# Patient Record
Sex: Male | Born: 1985 | Race: Black or African American | Hispanic: No | Marital: Single | Smoking: Current every day smoker
Health system: Southern US, Community
[De-identification: ages and names within clinical notes are randomized; demographics above are authoritative.]

---

## 2011-04-30 ENCOUNTER — Emergency Department: Payer: Self-pay | Admitting: Emergency Medicine

## 2016-10-03 ENCOUNTER — Encounter: Payer: Self-pay | Admitting: Emergency Medicine

## 2016-10-03 ENCOUNTER — Emergency Department: Payer: Commercial Managed Care - PPO

## 2016-10-03 ENCOUNTER — Emergency Department
Admission: EM | Admit: 2016-10-03 | Discharge: 2016-10-03 | Disposition: A | Discharge status: Home / Self Care | Payer: Commercial Managed Care - PPO | Attending: Emergency Medicine | Admitting: Emergency Medicine

## 2016-10-03 DIAGNOSIS — K219 Gastro-esophageal reflux disease without esophagitis: Secondary | ICD-10-CM | POA: Diagnosis not present

## 2016-10-03 DIAGNOSIS — R0789 Other chest pain: Secondary | ICD-10-CM | POA: Diagnosis present

## 2016-10-03 DIAGNOSIS — F17210 Nicotine dependence, cigarettes, uncomplicated: Secondary | ICD-10-CM | POA: Insufficient documentation

## 2016-10-03 LAB — CBC
HEMATOCRIT: 50.3 % (ref 40.0–52.0)
Hemoglobin: 17.3 g/dL (ref 13.0–18.0)
MCH: 31.9 pg (ref 26.0–34.0)
MCHC: 34.5 g/dL (ref 32.0–36.0)
MCV: 92.5 fL (ref 80.0–100.0)
PLATELETS: 283 10*3/uL (ref 150–440)
RBC: 5.43 MIL/uL (ref 4.40–5.90)
RDW: 12.4 % (ref 11.5–14.5)
WBC: 14.9 10*3/uL — AB (ref 3.8–10.6)

## 2016-10-03 LAB — BASIC METABOLIC PANEL
Anion gap: 10 (ref 5–15)
BUN: 10 mg/dL (ref 6–20)
CHLORIDE: 102 mmol/L (ref 101–111)
CO2: 25 mmol/L (ref 22–32)
CREATININE: 1.14 mg/dL (ref 0.61–1.24)
Calcium: 9.3 mg/dL (ref 8.9–10.3)
GFR calc Af Amer: >60 mL/min (ref >60)
GFR calc non Af Amer: >60 mL/min (ref >60)
GLUCOSE: 106 mg/dL — AB (ref 65–99)
Potassium: 3.3 mmol/L — ABNORMAL LOW (ref 3.5–5.1)
SODIUM: 137 mmol/L (ref 135–145)

## 2016-10-03 LAB — TROPONIN I: Troponin I: <0.03 ng/mL (ref <0.03)

## 2016-10-03 MED ORDER — SUCRALFATE 1 G PO TABS: 1.0000 g | ORAL_TABLET | Freq: Four times a day (QID) | ORAL | 1 refills | Status: AC

## 2016-10-03 MED ORDER — FAMOTIDINE 20 MG PO TABS
40.0000 mg | ORAL_TABLET | Freq: Once | ORAL | Status: AC
  Administered 2016-10-03: 40 mg via ORAL
  Filled 2016-10-03: qty 2

## 2016-10-03 MED ORDER — GI COCKTAIL ~~LOC~~
30.0000 mL | ORAL | Status: AC
  Administered 2016-10-03: 30 mL via ORAL
  Filled 2016-10-03: qty 30

## 2016-10-03 MED ORDER — FAMOTIDINE 20 MG PO TABS: 20.0000 mg | ORAL_TABLET | Freq: Two times a day (BID) | ORAL | 0 refills | Status: AC

## 2016-10-03 NOTE — ED Triage Notes (Signed)
Patient presents to the ED with central chest pain radiating into his left side and left arm that began approx. 30 minutes ago.  Patient reports shortness of breath and weakness, denies nausea and diaphoresis.  Patient is alert and oriented x 4.  No obvious distress at this time.

## 2016-10-03 NOTE — ED Provider Notes (Signed)
[facility] Emergency Department Provider Note  ____________________________________________  Time seen: Approximately 12:08 PM  I have reviewed the triage vital signs and the nursing notes.   HISTORY  Chief Complaint Chest Pain    HPI George Koch is a 31 y.o. male who complains of central chest pain described as tightness and burning that started around 3 AM today. Intermittent episodes lasting 10 minutes at a time. Feels worse lying supine or on his stomach, better sitting up. Not exertional, not pleuritic. No shortness of breath diaphoresis or vomiting. Does feel some tightness in his left upper arm as well but not frankly radiating..Never had anything like this before. He does smoke every day, but denies any other medical history. No recent travel, hospitalization or surgery. Pain started after he got off from work at 2 AM today. He last ate at 5 PM yesterday. Hasn't noticed if eating or drinking makes it better or worse.     History reviewed. No pertinent past medical history.   There are no active problems to display for this patient.    History reviewed. No pertinent surgical history.   Prior to Admission medications   Medication Sig Start Date End Date Taking? Authorizing Provider  famotidine (PEPCID) 20 MG tablet Take 1 tablet (20 mg total) by mouth 2 (two) times daily. 10/03/16   Sharman CheekPhillip Daimien Patmon, MD  sucralfate (CARAFATE) 1 g tablet Take 1 tablet (1 g total) by mouth 4 (four) times daily. 10/03/16   Sharman CheekPhillip Castulo Scarpelli, MD     Allergies Penicillins   No family history on file.  Social History Social History  Substance Use Topics  . Smoking status: Current Every Day Smoker    Packs/day: 0.75    Types: Cigarettes  . Smokeless tobacco: Never Used  . Alcohol use Yes     Comment: "every once in a while"    Review of Systems  Constitutional:   No fever or chills.  ENT:   No sore throat. No rhinorrhea. Cardiovascular:   Positive as above  chest pain. Respiratory:   No dyspnea or cough. Gastrointestinal:   Negative for abdominal pain, vomiting and diarrhea.  Genitourinary:   Negative for dysuria or difficulty urinating. Musculoskeletal:   Negative for focal pain or swelling Neurological:   Negative for headaches 10-point ROS otherwise negative.  ____________________________________________   PHYSICAL EXAM:  VITAL SIGNS: ED Triage Vitals [10/03/16 0721]  Enc Vitals Group     BP (!) 153/100     Pulse Rate 95     Resp 18     Temp 98.9 F (37.2 C)     Temp Source Oral     SpO2 100 %     Weight (!) 313 lb (142 kg)     Height 6\' 3"  (1.905 m)     Head Circumference      Peak Flow      Pain Score 8     Pain Loc      Pain Edu?      Excl. in GC?     Vital signs reviewed, nursing assessments reviewed.   Constitutional:   Alert and oriented. Well appearing and in no distress. Eyes:   No scleral icterus. No conjunctival pallor. PERRL. EOMI.  No nystagmus. ENT   Head:   Normocephalic and atraumatic.   Nose:   No congestion/rhinnorhea. No septal hematoma   Mouth/Throat:   MMM, no pharyngeal erythema. No peritonsillar mass.    Neck:   No stridor. No SubQ emphysema. No  meningismus. Hematological/Lymphatic/Immunilogical:   No cervical lymphadenopathy. Cardiovascular:   RRR. Symmetric bilateral radial and DP pulses.  No murmurs.  Respiratory:   Normal respiratory effort without tachypnea nor retractions. Breath sounds are clear and equal bilaterally. No wheezes/rales/rhonchi. Chest wall nontender Gastrointestinal:   Soft and nontender. Non distended. There is no CVA tenderness.  No rebound, rigidity, or guarding. Genitourinary:   deferred Musculoskeletal:   Nontender with normal range of motion in all extremities. No joint effusions.  No lower extremity tenderness.  No edema. Neurologic:   Normal speech and language.  CN 2-10 normal. Motor grossly intact. No gross focal neurologic deficits are appreciated.   Skin:    Skin is warm, dry and intact. No rash noted.  No petechiae, purpura, or bullae.  ____________________________________________    LABS (pertinent positives/negatives) (all labs ordered are listed, but only abnormal results are displayed) Labs Reviewed  BASIC METABOLIC PANEL - Abnormal; Notable for the following:       Result Value   Potassium 3.3 (*)    Glucose, Bld 106 (*)    All other components within normal limits  CBC - Abnormal; Notable for the following:    WBC 14.9 (*)    All other components within normal limits  TROPONIN I   ____________________________________________   EKG  Interpreted by me  Date: 10/03/2016  Rate: 100  Rhythm: normal sinus rhythm  QRS Axis: normal  Intervals: normal  ST/T Wave abnormalities: normal  Conduction Disutrbances: none  Narrative Interpretation: unremarkable      ____________________________________________    RADIOLOGY  Chest x-ray unremarkable  ____________________________________________   PROCEDURES Procedures  ____________________________________________   INITIAL IMPRESSION / ASSESSMENT AND PLAN / ED COURSE  Pertinent labs & imaging results that were available during my care of the patient were reviewed by me and considered in my medical decision making (see chart for details).  Patient well appearing no acute distress good presents with atypical chest pain. Vital signs EKG chest x-ray and labs are all unremarkable. Exam is unremarkable.Considering the patient's symptoms, medical history, and physical examination today, I have low suspicion for ACS, PE, TAD, pneumothorax, carditis, mediastinitis, pneumonia, CHF, or sepsis.  Higher suspicion is for GERD. He does feel better after GI cocktail in the ED. All continue him on Carafate and famotidine, follow up with primary care. He has an appointment with primary care in 1 week. Encouraged him to keep this appointment where he can reassess the symptoms at  that time. He has no significant risk factors for cardiac disease to warrant further testing at this time. He asks about checking his thyroid today, but have very very low suspicion that this is related to any thyroid dysfunction and recommended he discuss this with his primary care doctor.     Clinical Course    ____________________________________________   FINAL CLINICAL IMPRESSION(S) / ED DIAGNOSES  Final diagnoses:  Atypical chest pain  Gastroesophageal reflux disease, esophagitis presence not specified      New Prescriptions   FAMOTIDINE (PEPCID) 20 MG TABLET    Take 1 tablet (20 mg total) by mouth 2 (two) times daily.   SUCRALFATE (CARAFATE) 1 G TABLET    Take 1 tablet (1 g total) by mouth 4 (four) times daily.     Portions of this note were generated with dragon dictation software. Dictation errors may occur despite best attempts at proofreading.    Sharman Cheek, MD 10/03/16 1212

## 2016-10-10 ENCOUNTER — Ambulatory Visit: Payer: Self-pay | Admitting: Family Medicine

## 2016-10-18 ENCOUNTER — Ambulatory Visit: Payer: Commercial Managed Care - PPO | Admitting: Primary Care

## 2016-10-31 ENCOUNTER — Ambulatory Visit: Payer: Self-pay | Admitting: Family Medicine

## 2018-08-28 IMAGING — CR DG CHEST 2V
2 series · 2 of 2 positions shown · non-contrast
Comparison: None.

CLINICAL DATA: Central chest pain radiating to the left

EXAM:
CHEST  2 VIEW

[chest pa]
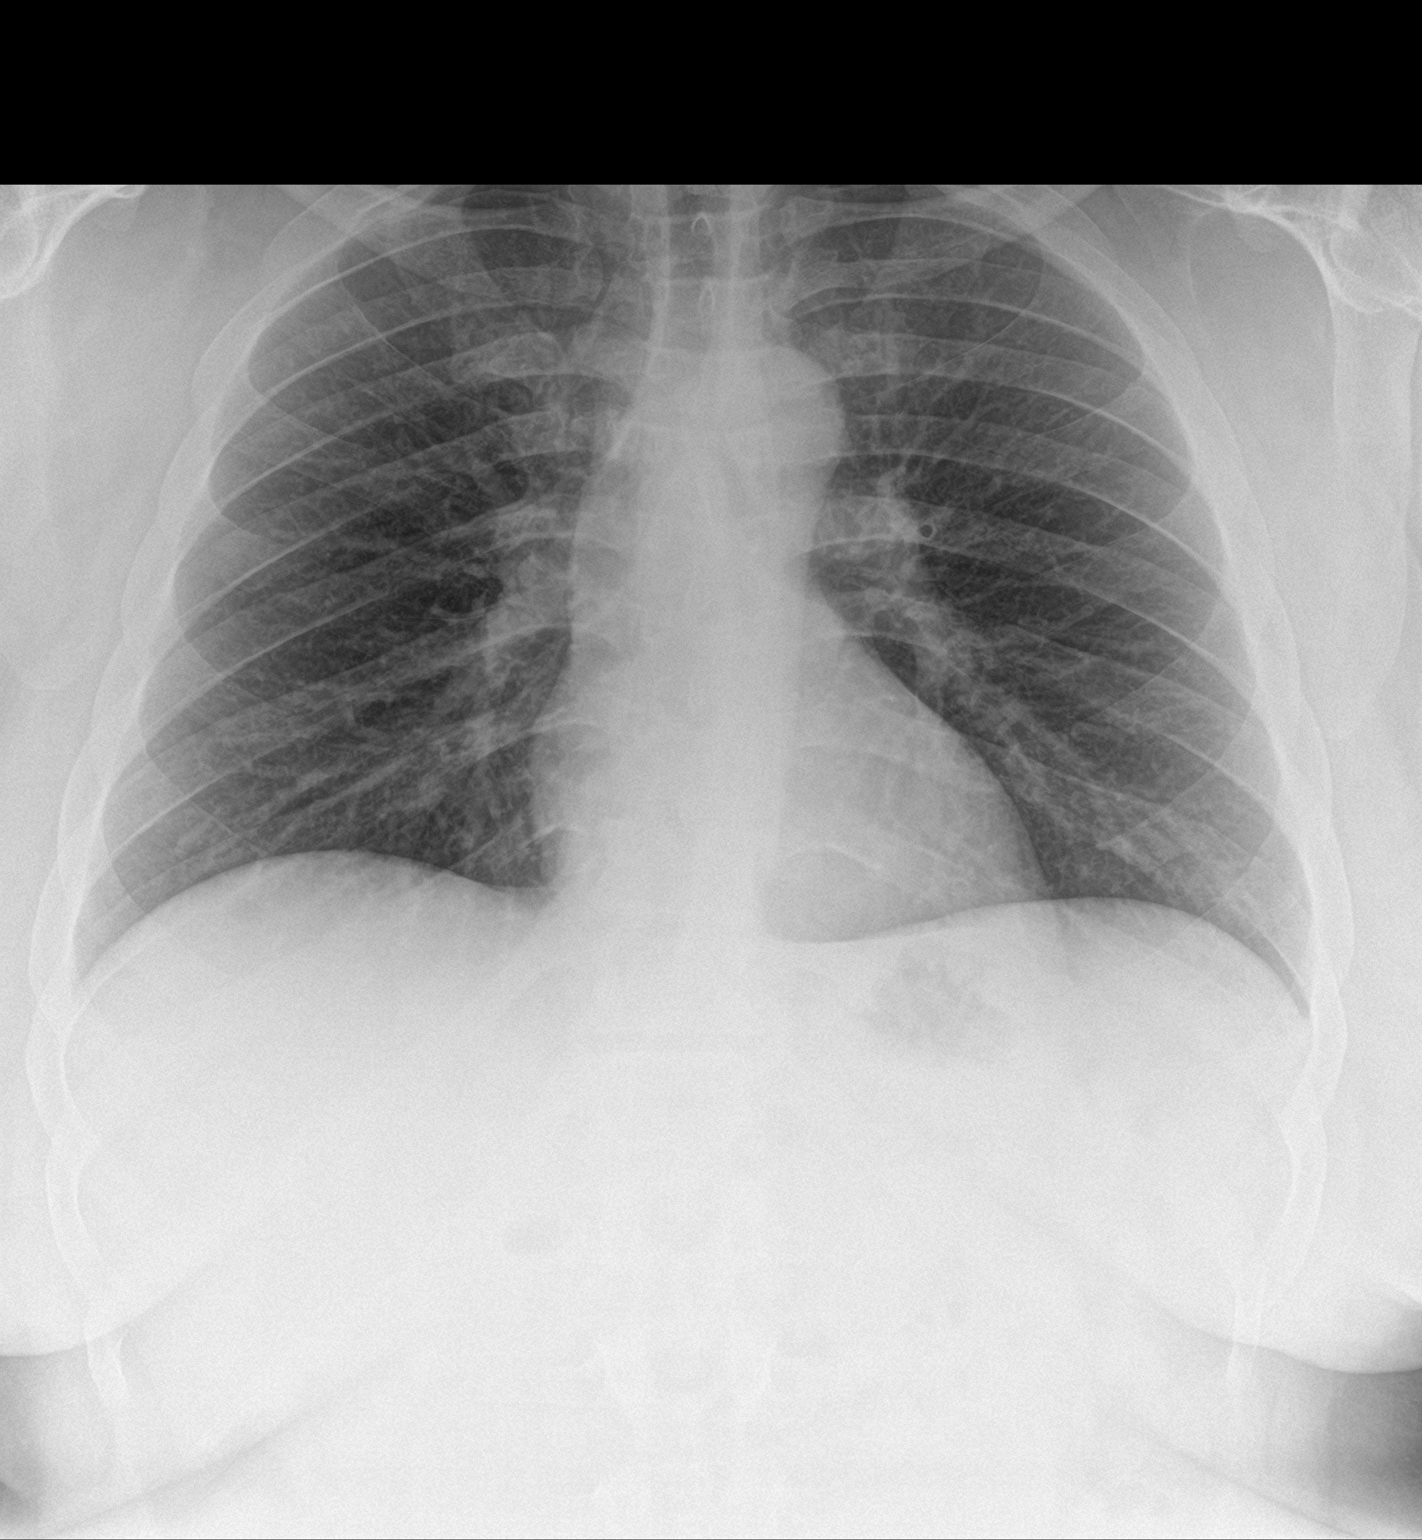

[chest lat]
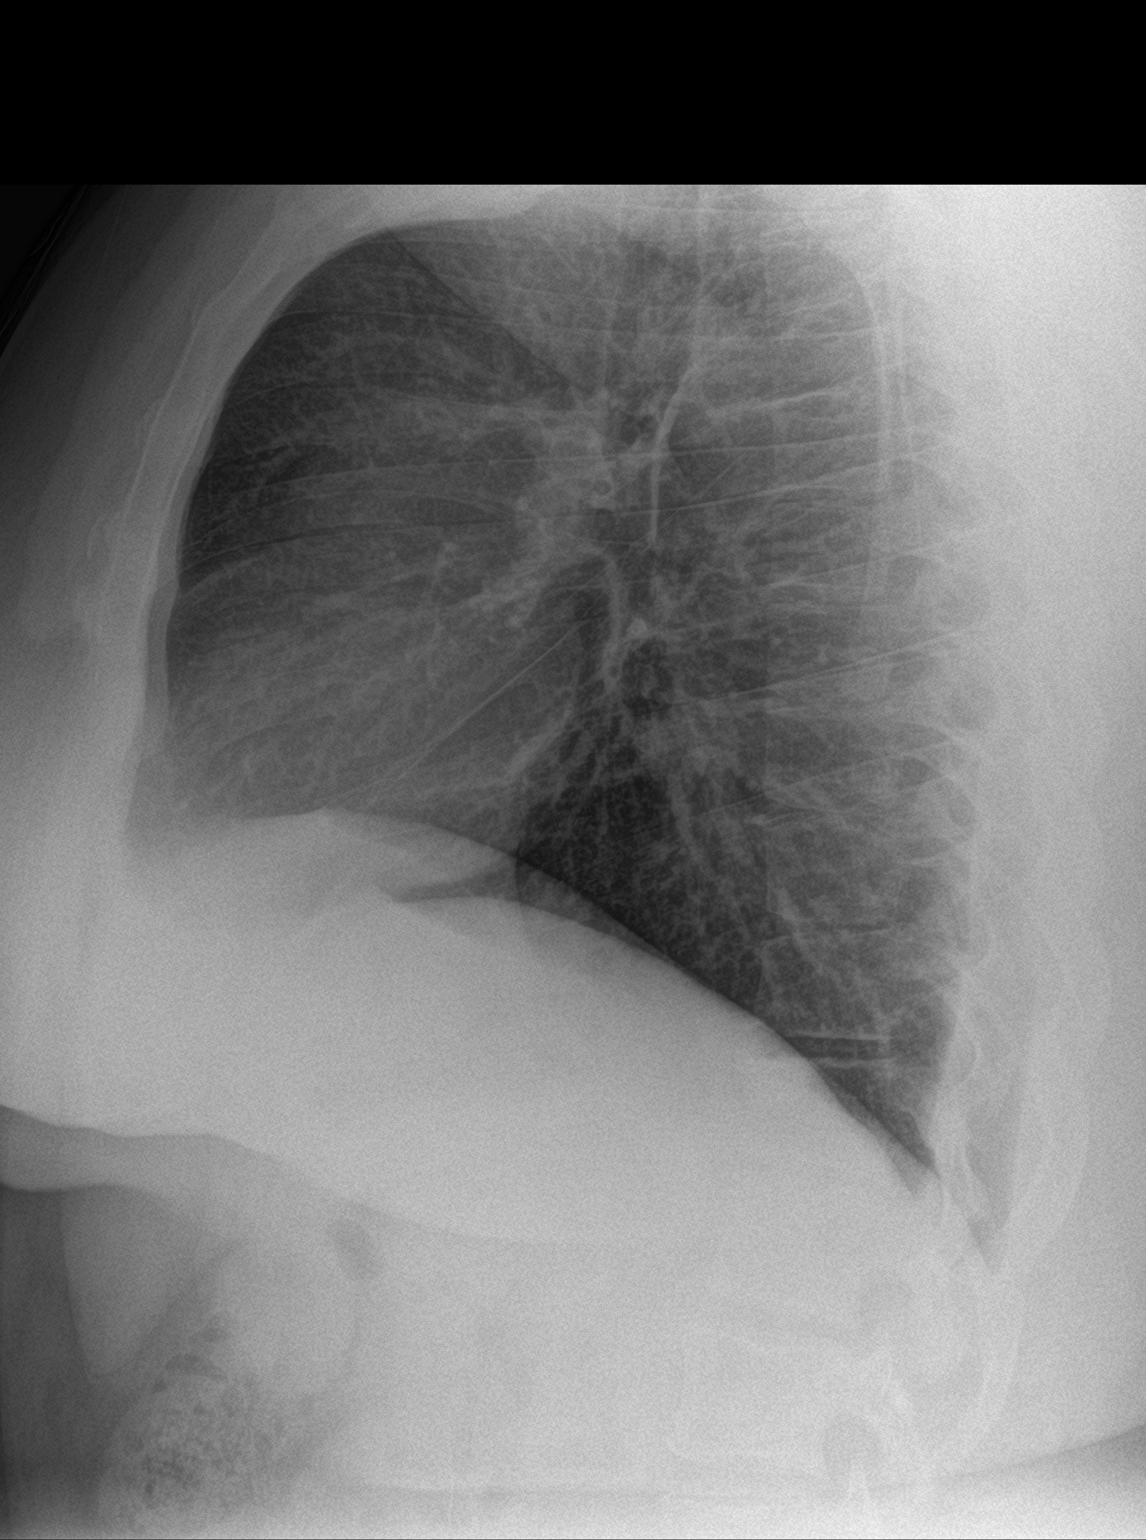

[2 of 2 positions shown; findings below may reference images not displayed]

FINDINGS: The heart size and mediastinal contours are within normal limits.
Both lungs are clear. The visualized skeletal structures are
unremarkable.
IMPRESSION: No active cardiopulmonary disease.
